# Patient Record
Sex: Female | Born: 1995 | Marital: Single | Smoking: Never smoker
Health system: Southern US, Community
[De-identification: ages and names within clinical notes are randomized; demographics above are authoritative.]

---
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
## 2020-02-11 ENCOUNTER — Emergency Department (HOSPITAL_COMMUNITY): Payer: BC Managed Care – PPO

## 2020-02-11 ENCOUNTER — Encounter (HOSPITAL_COMMUNITY): Payer: Self-pay | Admitting: Emergency Medicine

## 2020-02-11 ENCOUNTER — Other Ambulatory Visit: Payer: Self-pay

## 2020-02-11 ENCOUNTER — Emergency Department (HOSPITAL_COMMUNITY)
Admission: EM | Admit: 2020-02-11 | Discharge: 2020-02-11 | Disposition: A | Discharge status: Home / Self Care | Payer: BC Managed Care – PPO | Attending: Emergency Medicine | Admitting: Emergency Medicine

## 2020-02-11 DIAGNOSIS — M25572 Pain in left ankle and joints of left foot: Secondary | ICD-10-CM | POA: Diagnosis present

## 2020-02-11 DIAGNOSIS — R2242 Localized swelling, mass and lump, left lower limb: Secondary | ICD-10-CM | POA: Insufficient documentation

## 2020-02-11 MED ORDER — NAPROXEN 500 MG PO TABS: 500.0000 mg | ORAL_TABLET | Freq: Two times a day (BID) | ORAL | 0 refills | Status: AC

## 2020-02-11 NOTE — Discharge Instructions (Signed)
You were seen in the ER for a left ankle sprain.  X-ray shows no fracture.  Use ankle brace and crutches, continue activity weightbearing as tolerated, apply ice.  We will send you home with naproxen which is an anti-inflammatory to help with the pain.  Please follow-up with Melony Overly with [facility] orthopedics as soon as possible.
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
## 2021-03-27 ENCOUNTER — Encounter (HOSPITAL_COMMUNITY): Payer: Self-pay | Admitting: Obstetrics and Gynecology

## 2021-03-27 ENCOUNTER — Emergency Department (HOSPITAL_COMMUNITY)
Admission: EM | Admit: 2021-03-27 | Discharge: 2021-03-28 | Disposition: A | Discharge status: Home / Self Care | Payer: Self-pay | Attending: Emergency Medicine | Admitting: Emergency Medicine

## 2021-03-27 ENCOUNTER — Other Ambulatory Visit: Payer: Self-pay

## 2021-03-27 DIAGNOSIS — N760 Acute vaginitis: Secondary | ICD-10-CM | POA: Insufficient documentation

## 2021-03-27 DIAGNOSIS — D259 Leiomyoma of uterus, unspecified: Secondary | ICD-10-CM | POA: Insufficient documentation

## 2021-03-27 DIAGNOSIS — D219 Benign neoplasm of connective and other soft tissue, unspecified: Secondary | ICD-10-CM

## 2021-03-27 DIAGNOSIS — N83202 Unspecified ovarian cyst, left side: Secondary | ICD-10-CM | POA: Insufficient documentation

## 2021-03-27 DIAGNOSIS — B9689 Other specified bacterial agents as the cause of diseases classified elsewhere: Secondary | ICD-10-CM | POA: Insufficient documentation

## 2021-03-27 DIAGNOSIS — N83209 Unspecified ovarian cyst, unspecified side: Secondary | ICD-10-CM

## 2021-03-27 NOTE — ED Provider Notes (Signed)
[facility] DEPT Provider Note   CSN: [id number] Arrival date & time: 03/27/21  2121     History Chief Complaint  Patient presents with  . Abdominal Pain    Kelly Livingston is a 25 y.o. female.  HPI   Pt is a 25 y/o female who presents to the ED today for eval of abd pain. States that pain located to the right lower abdomen and has been waxing and waning for months. States pain is worse when she laughs or coughs too hard. Currently pain is 3/10.  Reports associated nausea, but denies vomiting, diarrhea, constipation, fevers, dysuria, frequency, urgency. Reports that she is currently on her menstrual cycle. Denies any abnormal vaginal discharge. She has been sexually active with 1 partner. She does not use protection every time. [facility] at that time which was negative.   History reviewed. No pertinent past medical history.  Patient Active Problem List   Diagnosis Date Noted  . HYPERCHOLESTEROLEMIA, MILD 08/02/2009  . DYSTHYMIA, SITUATIONAL 07/20/2009  . OVERWEIGHT 10/02/2006    History reviewed. No pertinent surgical history.   OB History    Gravida  0   Para  0   Term  0   Preterm  0   AB  0   Living  0     SAB  0   IAB  0   Ectopic  0   Multiple  0   Live Births  0           No family history on file.  Social History   Tobacco Use  . Smoking status: Never Smoker  . Smokeless tobacco: Never Used  Vaping Use  . Vaping Use: Never used  Substance Use Topics  . Alcohol use: Yes    Comment: Social  . Drug use: Yes    Types: Marijuana    Home Medications Prior to Admission medications   Medication Sig Start Date End Date Taking? Authorizing Provider  ibuprofen (ADVIL) 600 MG tablet Take 1 tablet (600 mg total) by mouth every 6 (six) hours as needed. 03/28/21  Yes Nyiah Pianka S, PA-C  metroNIDAZOLE (FLAGYL) 500 MG tablet Take 1 tablet (500 mg total) by mouth 2  (two) times daily for 7 days. 03/28/21 04/04/21 Yes Rawan Riendeau S, PA-C  naproxen (NAPROSYN) 500 MG tablet Take 1 tablet (500 mg total) by mouth 2 (two) times daily. 02/11/20   Garald Balding, PA-C    Allergies    Patient has no known allergies.  Review of Systems   Review of Systems  Constitutional: Negative for chills and fever.  HENT: Negative for ear pain and sore throat.   Eyes: Negative for visual disturbance.  Respiratory: Negative for cough and shortness of breath.   Cardiovascular: Negative for chest pain.  Gastrointestinal: Positive for nausea. Negative for abdominal pain, constipation, diarrhea and vomiting.  Genitourinary: Negative for dysuria and hematuria.  Musculoskeletal: Negative for back pain.  Skin: Negative for rash.  Neurological: Negative for seizures and syncope.  All other systems reviewed and are negative.   Physical Exam Updated Vital Signs BP 130/87 (BP Location: Right Arm)   Pulse 60   Temp 99.4 F (37.4 C) (Oral)   Resp 18   LMP 03/05/2021   SpO2 100%   Physical Exam Vitals and nursing note reviewed.  Constitutional:      General: She is not in acute distress.    Appearance: She is  well-developed.  HENT:     Head: Normocephalic and atraumatic.  Eyes:     Conjunctiva/sclera: Conjunctivae normal.  Cardiovascular:     Rate and Rhythm: Normal rate and regular rhythm.     Heart sounds: Normal heart sounds. No murmur heard.   Pulmonary:     Effort: Pulmonary effort is normal. No respiratory distress.     Breath sounds: Normal breath sounds. No wheezing, rhonchi or rales.  Abdominal:     General: Bowel sounds are normal.     Palpations: Abdomen is soft.     Tenderness: There is abdominal tenderness in the right lower quadrant and left lower quadrant. There is no guarding or rebound.  Genitourinary:    Comments: Exam performed by Rodney Booze,  exam chaperoned Date: 03/28/2021 Pelvic exam: normal external genitalia without evidence of  trauma. VULVA: normal appearing vulva with no masses, tenderness or lesion. VAGINA: normal appearing vagina with normal color and discharge, no lesions. CERVIX: normal appearing cervix without lesions, cervical motion tenderness absent, cervical os closed with out purulent discharge; scant bleeding from the cervix. Wet prep and DNA probe for chlamydia and GC obtained.   ADNEXA: normal adnexa in size, nontender and no masses UTERUS: uterus is normal size, shape, consistency and nontender.   Musculoskeletal:     Cervical back: Neck supple.  Skin:    General: Skin is warm and dry.  Neurological:     Mental Status: She is alert.     ED Results / Procedures / Treatments   Labs (all labs ordered are listed, but only abnormal results are displayed) Labs Reviewed  WET PREP, GENITAL - Abnormal; Notable for the following components:      Result Value   Clue Cells Wet Prep HPF POC PRESENT (*)    WBC, Wet Prep HPF POC MODERATE (*)    All other components within normal limits  COMPREHENSIVE METABOLIC PANEL - Abnormal; Notable for the following components:   Total Bilirubin 0.2 (*)    All other components within normal limits  CBC - Abnormal; Notable for the following components:   Platelets 148 (*)    All other components within normal limits  URINALYSIS, ROUTINE W REFLEX MICROSCOPIC - Abnormal; Notable for the following components:   Ketones, ur 5 (*)    All other components within normal limits  LIPASE, BLOOD  I-STAT BETA HCG BLOOD, ED ([facility], [facility], [facility] ONLY)  GC/CHLAMYDIA PROBE AMP ([place]) NOT AT [facility]    EKG None  Radiology US Transvaginal Non-OB  Result Date: 03/28/2021 CLINICAL DATA:  Initial evaluation for right adnexal abnormality. EXAM: TRANSABDOMINAL AND TRANSVAGINAL ULTRASOUND OF PELVIS DOPPLER ULTRASOUND OF OVARIES TECHNIQUE: Both transabdominal and transvaginal ultrasound examinations of the pelvis were performed. Transabdominal technique was performed for global imaging of  the pelvis including uterus, ovaries, adnexal regions, and pelvic cul-de-sac. It was necessary to proceed with endovaginal exam following the transabdominal exam to visualize the uterus, endometrium, and ovaries. Color and duplex Doppler ultrasound was utilized to evaluate blood flow to the ovaries. COMPARISON:  Prior CT from earlier the same day. FINDINGS: Uterus Measurements: 8.6 x 3.5 x 4.4 cm = volume: 69.5 mL. Uterus is anteverted. 6 x 5 x 6 mm hypoechoic intramural fibroid present at the left anterior uterine body. Endometrium Thickness: 5.2 mm.  No focal abnormality visualized. Right ovary Measurements: 4.3 x 1.4 x 2.2 cm = volume: 7.2 mL. No adnexal mass seen to correspond with the noted abnormality on prior CT. The right ovary  itself is mildly elongated, suspected to of accounting for appearance on prior study. Left ovary Measurements: 4.9 x 2.6 x 3.8 cm = volume: 26.2 mL. 3.0 x 2.3 x 2.8 cm complex cyst seen within the left ovary. Lesion demonstrates scattered low-level internal echoes with some eccentric mural reticular densities. Few scattered irregular mural based echogenic nodular foci. No appreciable internal vascularity. Pulsed Doppler evaluation of both ovaries demonstrates normal low-resistance arterial and venous waveforms. Other findings No abnormal free fluid. IMPRESSION: 1. 3.0 cm complex left ovarian cyst, indeterminate, but favored to reflect a hemorrhagic cyst. While this is likely benign, given the presence of a few scattered internal nodular echogenic foci, a short interval follow-up ultrasound in 6-12 weeks is recommended to ensure resolution. 2. No evidence for ovarian torsion or other acute abnormality. 3. No right adnexal mass seen to correspond with abnormality on prior CT. The right ovary is somewhat elongated in morphology, suspected to account for the appearance on prior CT. 4. 6 mm intramural fibroid at the left anterior uterine body. Electronically Signed   By: Jeannine Boga M.D.   On: 03/28/2021 03:29   US Pelvis Complete  Result Date: 03/28/2021 CLINICAL DATA:  Initial evaluation for right adnexal abnormality. EXAM: TRANSABDOMINAL AND TRANSVAGINAL ULTRASOUND OF PELVIS DOPPLER ULTRASOUND OF OVARIES TECHNIQUE: Both transabdominal and transvaginal ultrasound examinations of the pelvis were performed. Transabdominal technique was performed for global imaging of the pelvis including uterus, ovaries, adnexal regions, and pelvic cul-de-sac. It was necessary to proceed with endovaginal exam following the transabdominal exam to visualize the uterus, endometrium, and ovaries. Color and duplex Doppler ultrasound was utilized to evaluate blood flow to the ovaries. COMPARISON:  Prior CT from earlier the same day. FINDINGS: Uterus Measurements: 8.6 x 3.5 x 4.4 cm = volume: 69.5 mL. Uterus is anteverted. 6 x 5 x 6 mm hypoechoic intramural fibroid present at the left anterior uterine body. Endometrium Thickness: 5.2 mm.  No focal abnormality visualized. Right ovary Measurements: 4.3 x 1.4 x 2.2 cm = volume: 7.2 mL. No adnexal mass seen to correspond with the noted abnormality on prior CT. The right ovary itself is mildly elongated, suspected to of accounting for appearance on prior study. Left ovary Measurements: 4.9 x 2.6 x 3.8 cm = volume: 26.2 mL. 3.0 x 2.3 x 2.8 cm complex cyst seen within the left ovary. Lesion demonstrates scattered low-level internal echoes with some eccentric mural reticular densities. Few scattered irregular mural based echogenic nodular foci. No appreciable internal vascularity. Pulsed Doppler evaluation of both ovaries demonstrates normal low-resistance arterial and venous waveforms. Other findings No abnormal free fluid. IMPRESSION: 1. 3.0 cm complex left ovarian cyst, indeterminate, but favored to reflect a hemorrhagic cyst. While this is likely benign, given the presence of a few scattered internal nodular echogenic foci, a short interval follow-up  ultrasound in 6-12 weeks is recommended to ensure resolution. 2. No evidence for ovarian torsion or other acute abnormality. 3. No right adnexal mass seen to correspond with abnormality on prior CT. The right ovary is somewhat elongated in morphology, suspected to account for the appearance on prior CT. 4. 6 mm intramural fibroid at the left anterior uterine body. Electronically Signed   By: Jeannine Boga M.D.   On: 03/28/2021 03:29   CT ABDOMEN PELVIS W CONTRAST  Result Date: 03/28/2021 CLINICAL DATA:  Recurrent right abdominal pain for greater than 1 month, worse with eating, nausea EXAM: CT ABDOMEN AND PELVIS WITH CONTRAST TECHNIQUE: Multidetector CT imaging of the abdomen and pelvis  was performed using the standard protocol following bolus administration of intravenous contrast. CONTRAST:  117mL OMNIPAQUE IOHEXOL 300 MG/ML  SOLN COMPARISON:  None. FINDINGS: Lower chest: Lung bases are clear. Normal heart size. No pericardial effusion. Hepatobiliary: No worrisome focal liver lesions. Smooth liver surface contour. Normal hepatic attenuation. Normal gallbladder and biliary tree. No visible calcified gallstones. Pancreas: No pancreatic ductal dilatation or surrounding inflammatory changes. Spleen: Normal in size. No concerning splenic lesions. Adrenals/Urinary Tract: Normal adrenals. Kidneys are normally located with symmetric enhancement. No suspicious renal lesion, urolithiasis or hydronephrosis. Urinary bladder is unremarkable for the degree of distention. Stomach/Bowel: Distal esophagus, stomach and duodenum are unremarkable. No small bowel thickening or dilatation. Mobile appearance of the cecum displaced slightly towards the right upper quadrant. No resulting obstruction or frank volvulus is seen. Diminutive noninflamed appendix extending laterally from the cecal tip. No colonic dilatation or wall thickening. Vascular/Lymphatic: Several clustered lymph nodes are seen in the right mid abdomen near the  displaced cecum measuring between 5 the mm which is possibly reactive though not pathologically enlarged. No other suspicious adenopathy in the abdomen or pelvis. No significant vascular findings. Reproductive: Anteverted uterus. Slightly elongated, intermediate attenuation structure in the right adnexa, could reflect a complex hydrosalpinx versus possible pyosalpinx (2/71). More bland appearing cyst/follicle in the left adnexa measuring 2.9 cm. Radiolucent tampon within the vaginal canal. Other: Small volume low-attenuation fluid in the deep pelvis, nonspecific in a reproductive age female. Musculoskeletal: Rudimentary ribs T12. No acute osseous abnormality or suspicious osseous lesion. IMPRESSION: 1. Slightly mobile cecum displaced to the right upper quadrant. No resulting obstruction or volvulus. 2. Noninflamed diminutive appendix is visualized. 3. Few clustered lymph nodes are prominent though non pathologically enlarged. Nonspecific finding though could be seen as a reactive process or with mesenteric adenitis though this is a diagnosis of exclusion. 4. Intermediate attenuation tubular structure in the right adnexa, could reflect hydrosalpinx or possible pyosalpinx. Correlate with clinical symptoms and consider further interrogation with pelvic ultrasound. 5. More normal appearing follicle in the left ovary measuring 2.9 cm. No follow-up imaging recommended. Note: This recommendation does not apply to premenarchal patients and to those with increased risk (genetic, family history, elevated tumor markers or other high-risk factors) of ovarian cancer. Reference: [facility] 2020 Feb; 17(2):248-254 Electronically Signed   By: Lovena Le M.D.   On: 03/28/2021 02:09   [place] Art/Ven Flow Abd Pelv Doppler  Result Date: 03/28/2021 CLINICAL DATA:  Initial evaluation for right adnexal abnormality. EXAM: TRANSABDOMINAL AND TRANSVAGINAL ULTRASOUND OF PELVIS DOPPLER ULTRASOUND OF OVARIES TECHNIQUE: Both transabdominal and  transvaginal ultrasound examinations of the pelvis were performed. Transabdominal technique was performed for global imaging of the pelvis including uterus, ovaries, adnexal regions, and pelvic cul-de-sac. It was necessary to proceed with endovaginal exam following the transabdominal exam to visualize the uterus, endometrium, and ovaries. Color and duplex Doppler ultrasound was utilized to evaluate blood flow to the ovaries. COMPARISON:  Prior CT from earlier the same day. FINDINGS: Uterus Measurements: 8.6 x 3.5 x 4.4 cm = volume: 69.5 mL. Uterus is anteverted. 6 x 5 x 6 mm hypoechoic intramural fibroid present at the left anterior uterine body. Endometrium Thickness: 5.2 mm.  No focal abnormality visualized. Right ovary Measurements: 4.3 x 1.4 x 2.2 cm = volume: 7.2 mL. No adnexal mass seen to correspond with the noted abnormality on prior CT. The right ovary itself is mildly elongated, suspected to of accounting for appearance on prior study. Left ovary Measurements: 4.9 x 2.6 x 3.8 cm =  volume: 26.2 mL. 3.0 x 2.3 x 2.8 cm complex cyst seen within the left ovary. Lesion demonstrates scattered low-level internal echoes with some eccentric mural reticular densities. Few scattered irregular mural based echogenic nodular foci. No appreciable internal vascularity. Pulsed Doppler evaluation of both ovaries demonstrates normal low-resistance arterial and venous waveforms. Other findings No abnormal free fluid. IMPRESSION: 1. 3.0 cm complex left ovarian cyst, indeterminate, but favored to reflect a hemorrhagic cyst. While this is likely benign, given the presence of a few scattered internal nodular echogenic foci, a short interval follow-up ultrasound in 6-12 weeks is recommended to ensure resolution. 2. No evidence for ovarian torsion or other acute abnormality. 3. No right adnexal mass seen to correspond with abnormality on prior CT. The right ovary is somewhat elongated in morphology, suspected to account for the  appearance on prior CT. 4. 6 mm intramural fibroid at the left anterior uterine body. Electronically Signed   By: Jeannine Boga M.D.   On: 03/28/2021 03:29    Procedures Procedures   Medications Ordered in ED Medications  iohexol (OMNIPAQUE) 300 MG/ML solution 100 mL (100 mLs Intravenous Contrast Given 03/28/21 0120)    ED Course  I have reviewed the triage vital signs and the nursing notes.  Pertinent labs & imaging results that were available during my care of the patient were reviewed by me and considered in my medical decision making (see chart for details).    MDM Rules/Calculators/A&P                          25 y/o f presenting for abd pain  Reviewed/interpreted labs CBC unremarkable CMP unremarkable  Lipase unremarkable Preg test neg UA neg for uti  Reviewed/interpreted imaging CT abd/pelvis -  1. Slightly mobile cecum displaced to the right upper quadrant. No resulting obstruction or volvulus. 2. Noninflamed diminutive appendix is visualized. 3. Few clustered lymph nodes are prominent though non pathologically enlarged. Nonspecific finding though could be seen as a reactive process or with mesenteric adenitis though this is a diagnosis of exclusion. 4. Intermediate attenuation tubular structure in the right adnexa, could reflect hydrosalpinx or possible pyosalpinx. Correlate with clinical symptoms and consider further interrogation with pelvic ultrasound. 5. More normal appearing follicle in the left ovary measuring 2.9 cm. Pelvic US -  1. 3.0 cm complex left ovarian cyst, indeterminate, but favored to reflect a hemorrhagic cyst. While this is likely benign, given the presence of a few scattered internal nodular echogenic foci, a short interval follow-up ultrasound in 6-12 weeks is recommended to ensure resolution. 2. No evidence for ovarian torsion or other acute abnormality. 3. No right adnexal mass seen to correspond with abnormality on prior CT. The right ovary is  somewhat elongated in morphology, suspected to account for the appearance on prior CT. 4. 6 mm intramural fibroid at the left anterior uterine body  W/u showed fibroids, ovarian cyst. This is likely the cause of pts sxs. Have advised ibuprofen, rx given. Also given flagyl for bv. Exam not suggestive of pid. Will have her f/u with her obgyn. Advised on strict return precautions. She voices understanding of the plan and reasons to return. All questions answered, pt stable for discharge.  Final Clinical Impression(s) / ED Diagnoses Final diagnoses:  Fibroid  Cyst of ovary, unspecified laterality  Bacterial vaginosis    Rx / DC Orders ED Discharge Orders         Ordered    metroNIDAZOLE (FLAGYL) 500 MG  tablet  2 times daily        03/28/21 0405    ibuprofen (ADVIL) 600 MG tablet  Every 6 hours PRN        03/28/21 0405           Lilah Mijangos S, PA-C 03/28/21 0602    Varney Biles, MD 03/29/21 1644

## 2021-03-27 NOTE — ED Triage Notes (Signed)
Emergency Medicine Provider Triage Evaluation Note  Kelly Livingston , a 25 y.o. female  was evaluated in triage.  Pt complains of abd pain.  Review of Systems  Positive: abd pain, nausea Negative: Fever, cp, sob, dysuria  Physical Exam  BP (!) 151/97 (BP Location: Left Arm)   Pulse 70   Temp 99.6 F (37.6 C) (Oral)   Resp 17   LMP 03/05/2021   SpO2 100%  Gen:   Awake, no distress   HEENT:  Atraumatic  Resp:  Normal effort  Cardiac:  Normal rate  Abd:   Nondistended, nontender  MSK:   Moves extremities without difficulty  Neuro:  Speech clear   Medical Decision Making  Medically screening exam initiated at 10:04 PM.  Appropriate orders placed.  Dene Landsberg was informed that the remainder of the evaluation will be completed by another provider, this initial triage assessment does not replace that evaluation, and the importance of remaining in the ED until their evaluation is complete.  Clinical Impression  Recurrent R side abdominal pain >1 month, worse with eating, nausea. Request pregnancy test as well.    Domenic Moras, PA-C 03/27/21 2212

## 2021-03-27 NOTE — ED Triage Notes (Signed)
[facility] Patient reports they took a pregnancy test and it was negative.

## 2021-03-28 ENCOUNTER — Emergency Department (HOSPITAL_COMMUNITY): Payer: Self-pay

## 2021-03-28 ENCOUNTER — Encounter (HOSPITAL_COMMUNITY): Payer: Self-pay

## 2021-03-28 LAB — COMPREHENSIVE METABOLIC PANEL
ALT: 28 U/L (ref 0–44)
AST: 22 U/L (ref 15–41)
Albumin: 4.3 g/dL (ref 3.5–5.0)
Alkaline Phosphatase: 64 U/L (ref 38–126)
Anion gap: 11 (ref 5–15)
BUN: 9 mg/dL (ref 6–20)
CO2: 25 mmol/L (ref 22–32)
Calcium: 9.8 mg/dL (ref 8.9–10.3)
Chloride: 106 mmol/L (ref 98–111)
Creatinine, Ser: 0.82 mg/dL (ref 0.44–1.00)
GFR, Estimated: >60 mL/min (ref >60)
Glucose, Bld: 83 mg/dL (ref 70–99)
Potassium: 3.9 mmol/L (ref 3.5–5.1)
Sodium: 142 mmol/L (ref 135–145)
Total Bilirubin: 0.2 mg/dL — ABNORMAL LOW (ref 0.3–1.2)
Total Protein: 7.6 g/dL (ref 6.5–8.1)

## 2021-03-28 LAB — URINALYSIS, ROUTINE W REFLEX MICROSCOPIC
Bilirubin Urine: NEGATIVE
Glucose, UA: NEGATIVE mg/dL
Hgb urine dipstick: NEGATIVE
Ketones, ur: 5 mg/dL — AB
Leukocytes,Ua: NEGATIVE
Nitrite: NEGATIVE
Protein, ur: NEGATIVE mg/dL
Specific Gravity, Urine: 1.01 (ref 1.005–1.030)
pH: 5 (ref 5.0–8.0)

## 2021-03-28 LAB — CBC
HCT: 37.3 % (ref 36.0–46.0)
Hemoglobin: 12.4 g/dL (ref 12.0–15.0)
MCH: 29.7 pg (ref 26.0–34.0)
MCHC: 33.2 g/dL (ref 30.0–36.0)
MCV: 89.2 fL (ref 80.0–100.0)
Platelets: 148 10*3/uL — ABNORMAL LOW (ref 150–400)
RBC: 4.18 MIL/uL (ref 3.87–5.11)
RDW: 13.7 % (ref 11.5–15.5)
WBC: 4.5 10*3/uL (ref 4.0–10.5)
nRBC: 0 % (ref 0.0–0.2)

## 2021-03-28 LAB — WET PREP, GENITAL
Sperm: NONE SEEN
Trich, Wet Prep: NONE SEEN
Yeast Wet Prep HPF POC: NONE SEEN

## 2021-03-28 LAB — LIPASE, BLOOD: Lipase: 25 U/L (ref 11–51)

## 2021-03-28 LAB — GC/CHLAMYDIA PROBE AMP (~~LOC~~) NOT AT ARMC
Chlamydia: NEGATIVE
Comment: NEGATIVE
Comment: NORMAL
Neisseria Gonorrhea: NEGATIVE

## 2021-03-28 LAB — I-STAT BETA HCG BLOOD, ED (MC, WL, AP ONLY): I-stat hCG, quantitative: <5 m[IU]/mL (ref <5)

## 2021-03-28 MED ORDER — METRONIDAZOLE 500 MG PO TABS: 500.0000 mg | ORAL_TABLET | Freq: Two times a day (BID) | ORAL | 0 refills | Status: AC

## 2021-03-28 MED ORDER — IOHEXOL 300 MG/ML  SOLN
100.0000 mL | Freq: Once | INTRAMUSCULAR | Status: AC | PRN
  Administered 2021-03-28: 100 mL via INTRAVENOUS

## 2021-03-28 MED ORDER — IBUPROFEN 600 MG PO TABS: 600.0000 mg | ORAL_TABLET | Freq: Four times a day (QID) | ORAL | 0 refills | Status: AC | PRN

## 2021-03-28 NOTE — Discharge Instructions (Addendum)
The results of your ultrasound are as follows, "1. 3.0 cm complex left ovarian cyst, indeterminate, but favored to reflect a hemorrhagic cyst. While this is likely benign, given the presence of a few scattered internal nodular echogenic foci, a short interval follow-up ultrasound in 6-12 weeks is recommended to ensure resolution. 2. No evidence for ovarian torsion or other acute abnormality. 3. No right adnexal mass seen to correspond with abnormality on prior CT. The right ovary is somewhat elongated in morphology, suspected to account for the appearance on prior CT. 4. 6 mm intramural fibroid at the left anterior uterine body."  Your wet prep showed bacterial vaginosis.  You are given an antibiotic to help treat this.  Do not drink alcohol while on this antibiotic as it will make you feel very sick.  Additionally you are given some ibuprofen to help with the pain You are having in your lower abdomen.  Please follow-up with your OB/GYN and return to the emergency department for any new or worsening symptoms in the meantime.

## 2022-09-01 IMAGING — US US PELVIS COMPLETE
1 series · 13 of 25 positions shown · non-contrast
Comparison: Prior CT from earlier the same day.

CLINICAL DATA: Initial evaluation for right adnexal abnormality.

EXAM:
TRANSABDOMINAL AND TRANSVAGINAL ULTRASOUND OF PELVIS
DOPPLER ULTRASOUND OF OVARIES
TECHNIQUE: Both transabdominal and transvaginal ultrasound examinations of the
pelvis were performed. Transabdominal technique was performed for
global imaging of the pelvis including uterus, ovaries, adnexal
regions, and pelvic cul-de-sac.
It was necessary to proceed with endovaginal exam following the
transabdominal exam to visualize the uterus, endometrium, and
ovaries. Color and duplex Doppler ultrasound was utilized to
evaluate blood flow to the ovaries.

[Series 1: us pelvis complete · 13 of 91 slices shown]
[im 1/91]
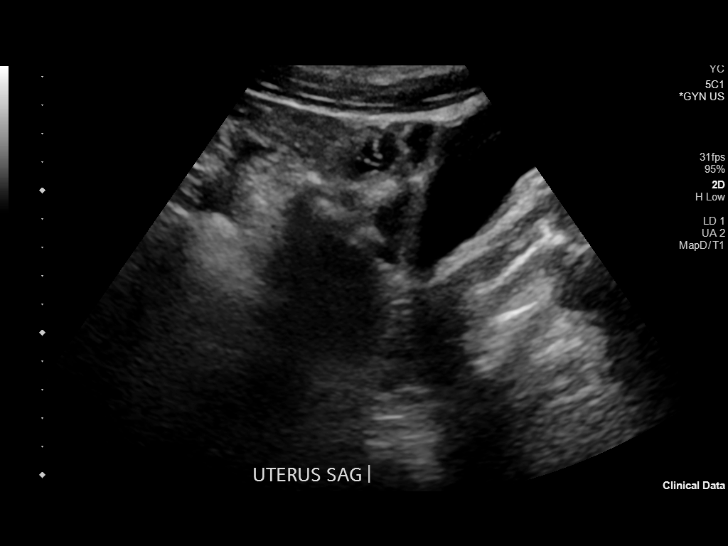
[im 8/91]
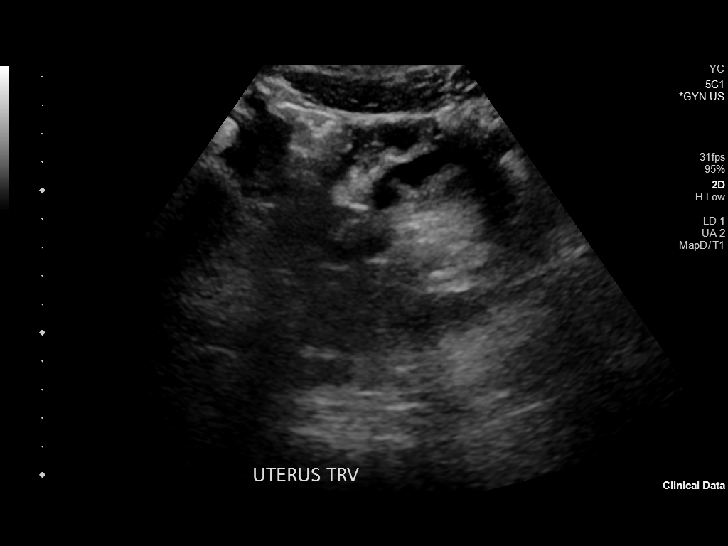
[im 16/91]
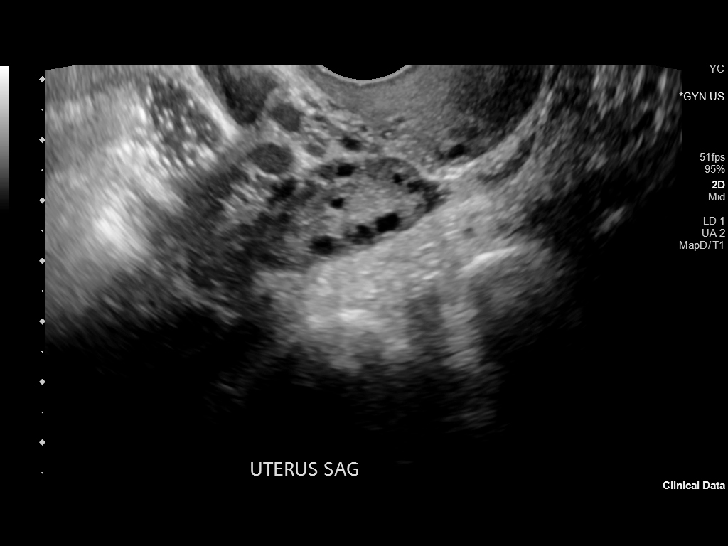
[im 23/91]
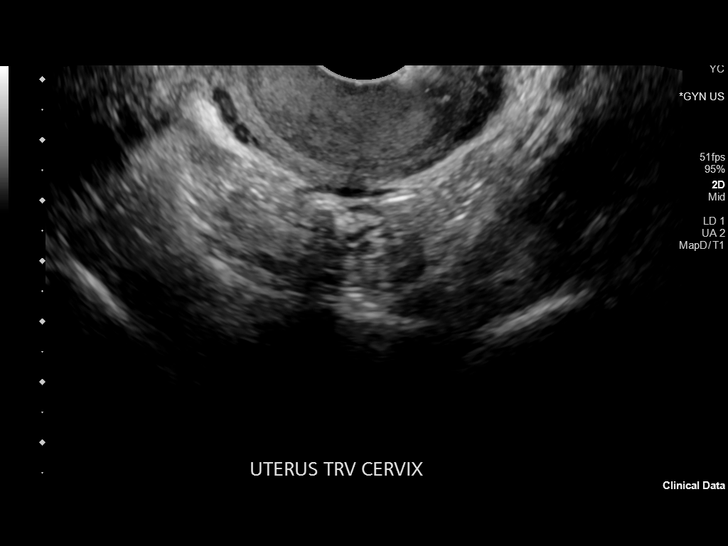
[im 31/91]
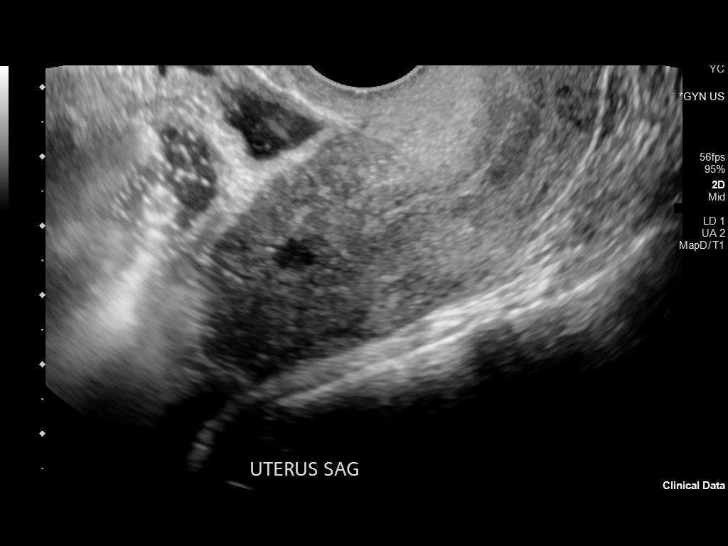
[im 38/91]
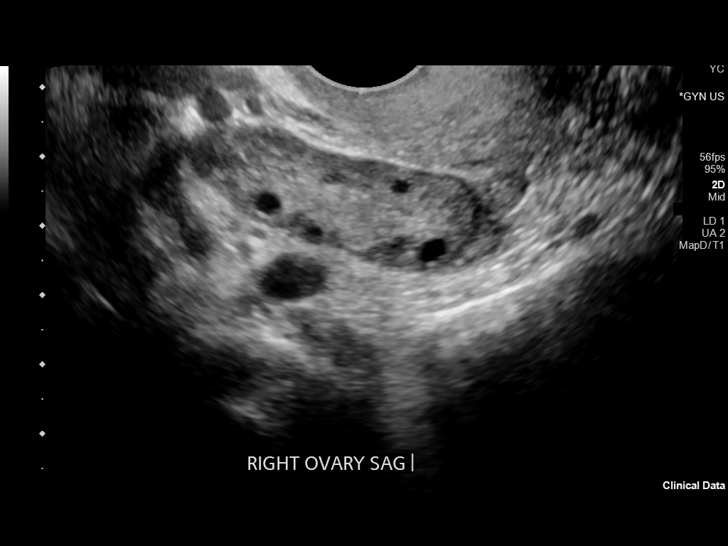
[im 46/91]
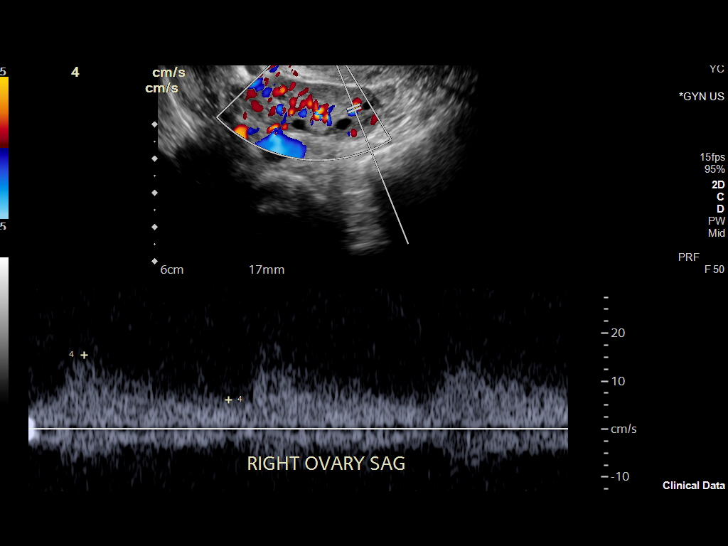
[im 53/91]
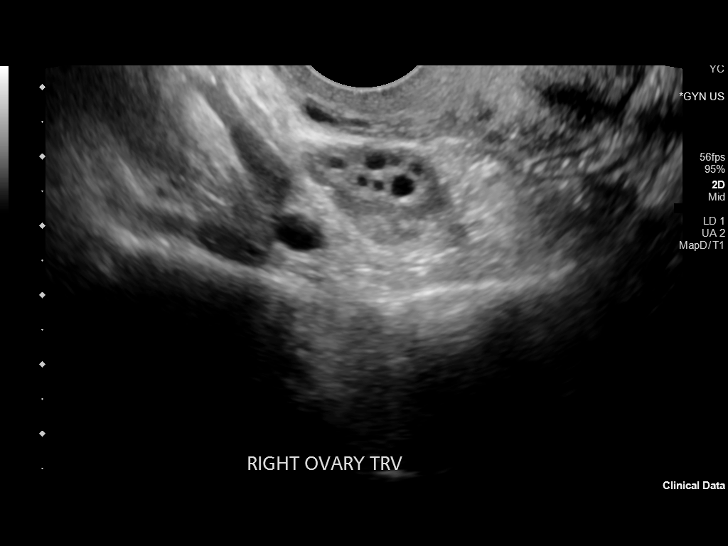
[im 61/91]
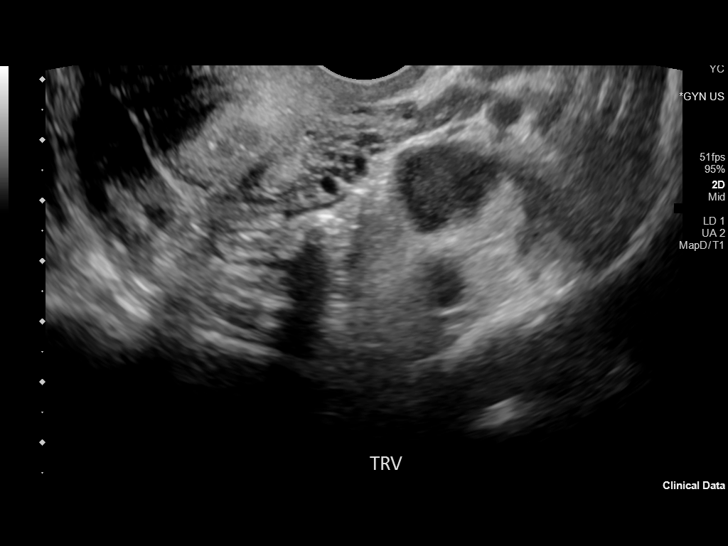
[im 68/91]
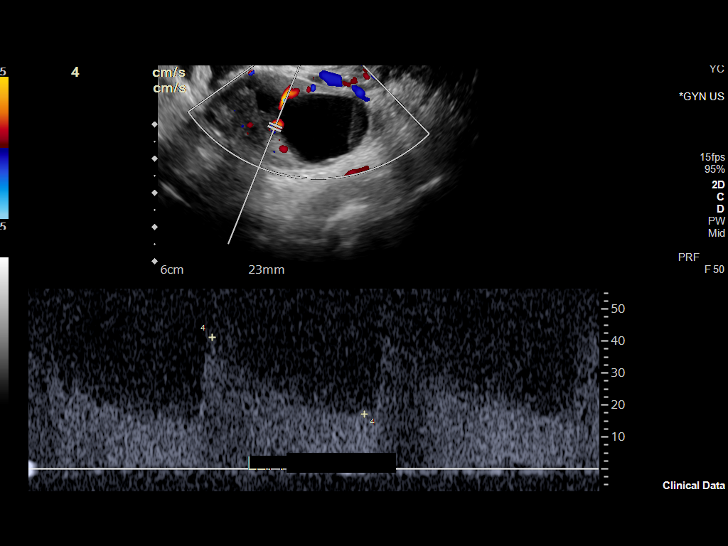
[im 76/91]
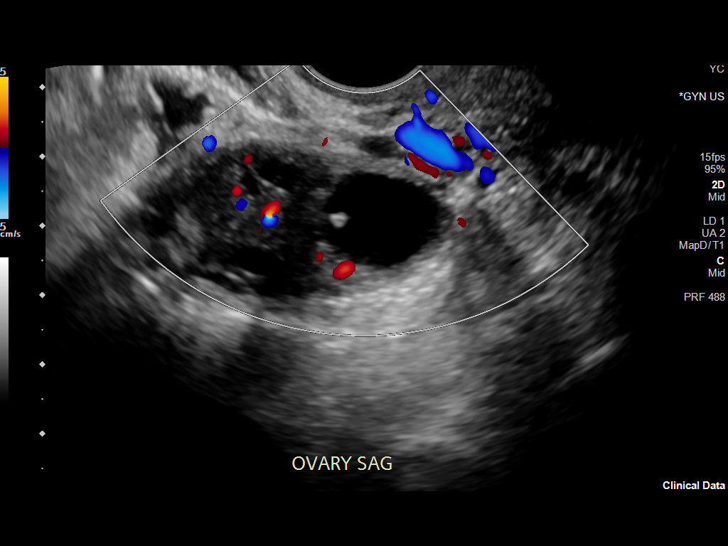
[im 83/91]
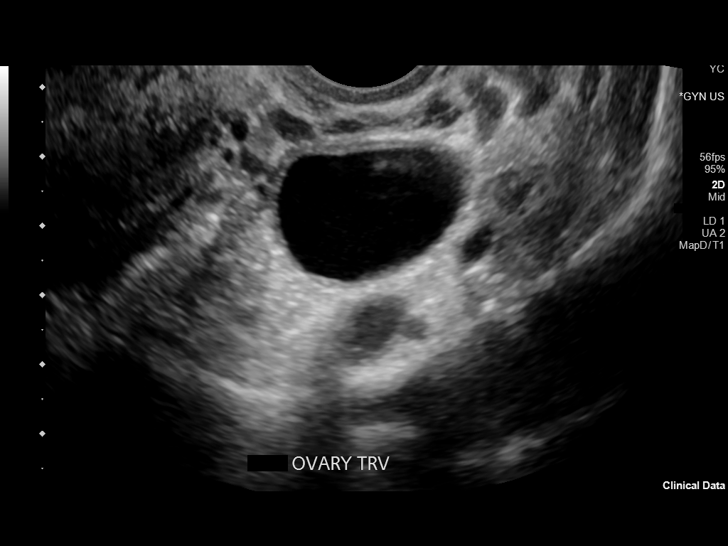
[im 91/91]
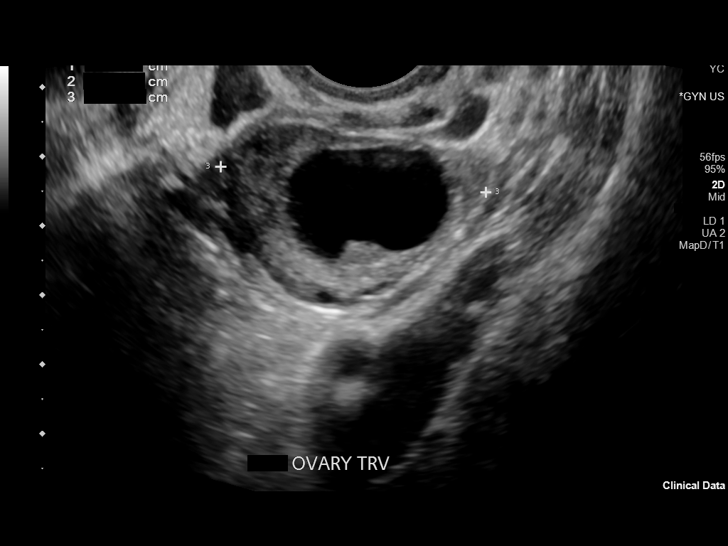

[13 of 25 positions shown; findings below may reference images not displayed]

FINDINGS: Uterus

Measurements: 8.6 x 3.5 x 4.4 cm = volume: 69.5 mL. Uterus is
anteverted. 6 x 5 x 6 mm hypoechoic intramural fibroid present at
the left anterior uterine body.

Endometrium

Thickness: 5.2 mm.  No focal abnormality visualized.

Right ovary

Measurements: 4.3 x 1.4 x 2.2 cm = volume: 7.2 mL. No adnexal mass
seen to correspond with the noted abnormality on prior CT. The right
ovary itself is mildly elongated, suspected to of accounting for
appearance on prior study.

Left ovary

Measurements: 4.9 x 2.6 x 3.8 cm = volume: 26.2 mL. 3.0 x 2.3 x
cm complex cyst seen within the left ovary. Lesion demonstrates
scattered low-level internal echoes with some eccentric mural
reticular densities. Few scattered irregular mural based echogenic
nodular foci. No appreciable internal vascularity.

Pulsed Doppler evaluation of both ovaries demonstrates normal
low-resistance arterial and venous waveforms.

Other findings

No abnormal free fluid.
IMPRESSION: 1. 3.0 cm complex left ovarian cyst, indeterminate, but favored to
reflect a hemorrhagic cyst. While this is likely benign, given the
presence of a few scattered internal nodular echogenic foci, a short
interval follow-up ultrasound in 6-12 weeks is recommended to ensure
resolution.
2. No evidence for ovarian torsion or other acute abnormality.
3. No right adnexal mass seen to correspond with abnormality on
prior CT. The right ovary is somewhat elongated in morphology,
suspected to account for the appearance on prior CT.
4. 6 mm intramural fibroid at the left anterior uterine body.

## 2022-09-01 IMAGING — CT CT ABD-PELV W/ CM
2 of 4 series · 15 of 46 positions shown, 17 images · IV contrast (omnipaque)
Comparison: None.

CLINICAL DATA: Recurrent right abdominal pain for greater than 1
month, worse with eating, nausea

EXAM:
CT ABDOMEN AND PELVIS WITH CONTRAST
TECHNIQUE: Multidetector CT imaging of the abdomen and pelvis was performed
using the standard protocol following bolus administration of
intravenous contrast.
CONTRAST:  100mL OMNIPAQUE IOHEXOL 300 MG/ML  SOLN

[Series 2: axial st · axial · 0.79mm/px · z∈[-498,-68]mm · 12 of 98 slices shown, 14 images]
[im 6/98  soft-tissue]
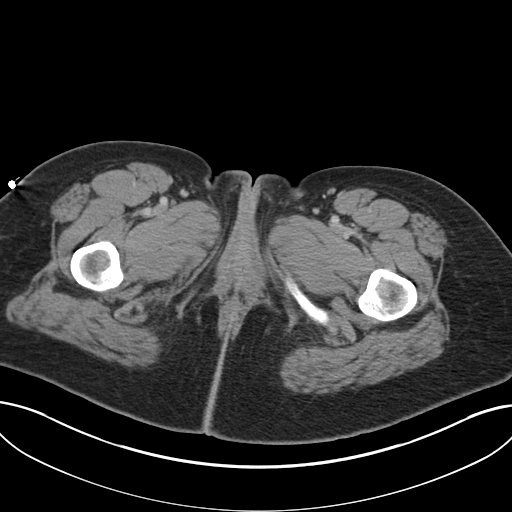
[im 6/98  bone]
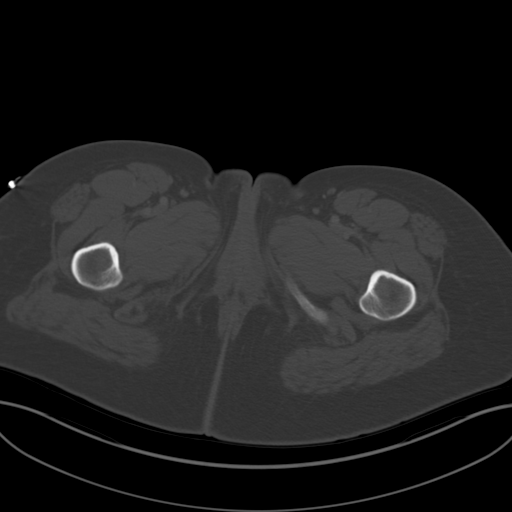
[im 17/98  soft-tissue]
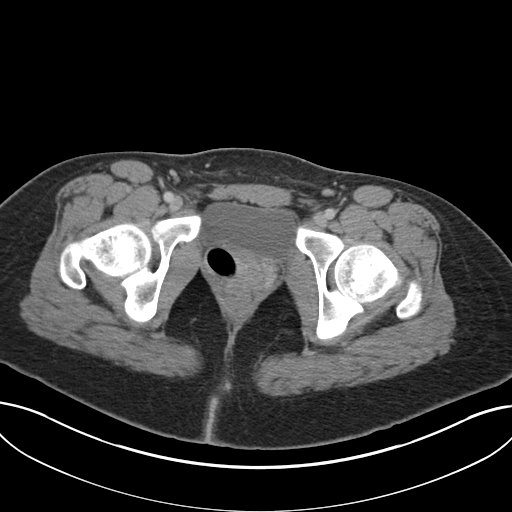
[im 22/98  soft-tissue]
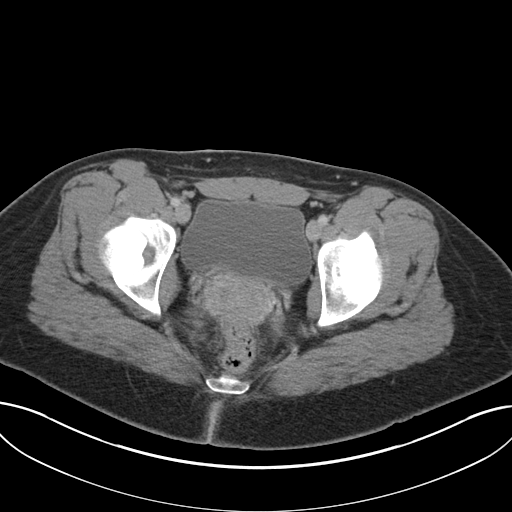
[im 27/98  soft-tissue]
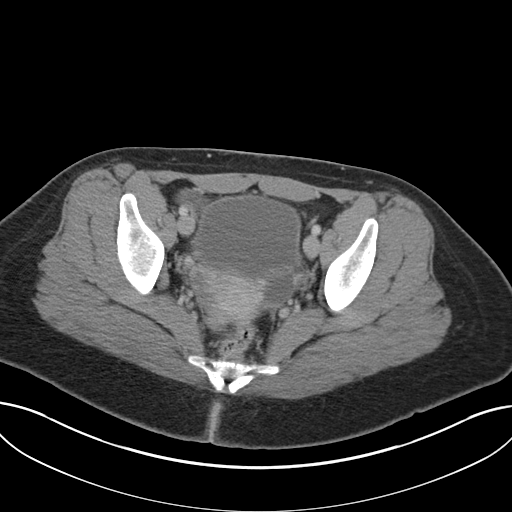
[im 38/98  soft-tissue]
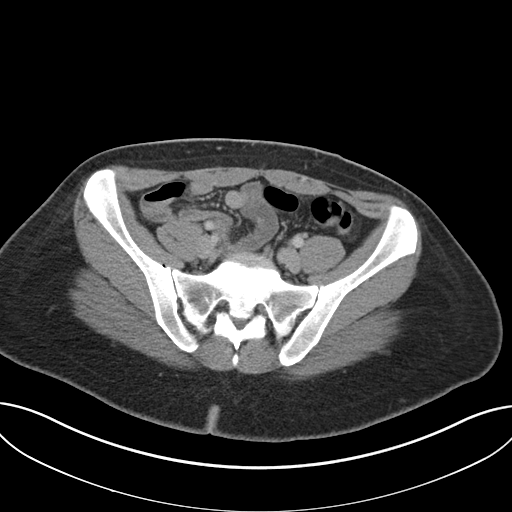
[im 44/98  soft-tissue]
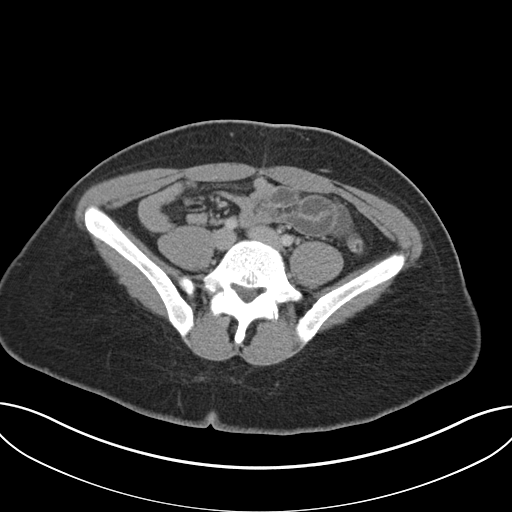
[im 54/98  soft-tissue]
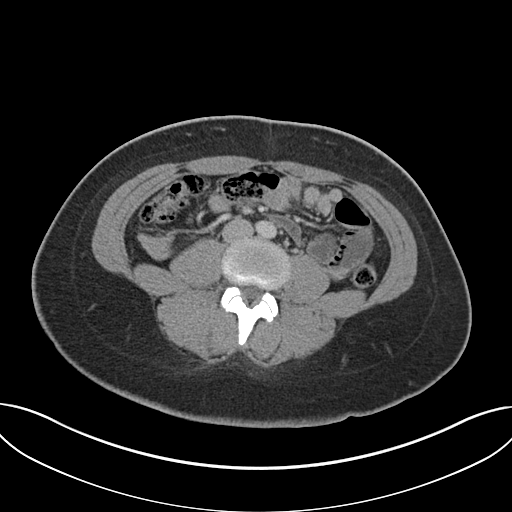
[im 60/98  soft-tissue]
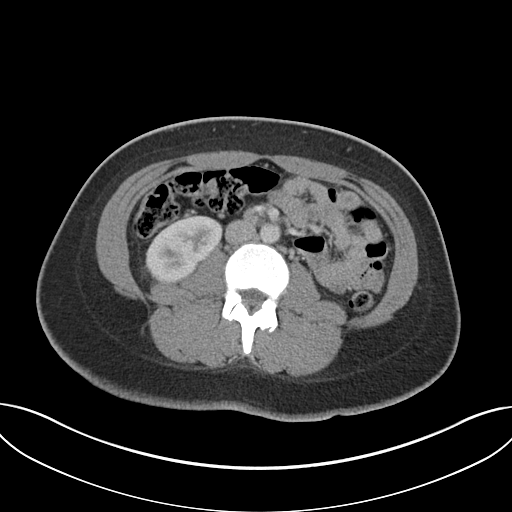
[im 71/98  soft-tissue]
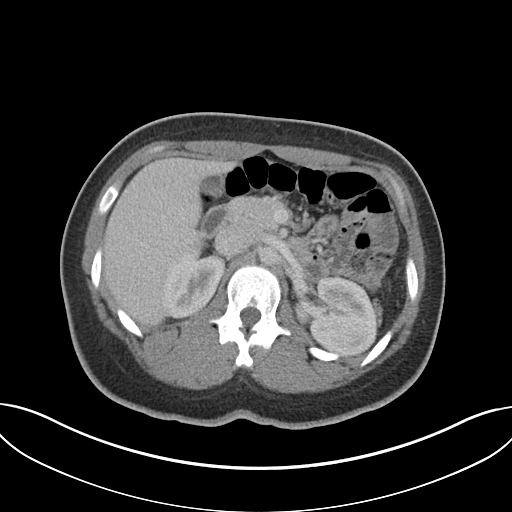
[im 71/98  bone]
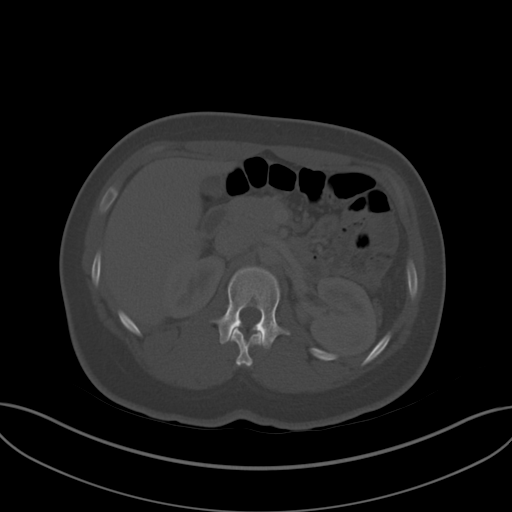
[im 76/98  soft-tissue]
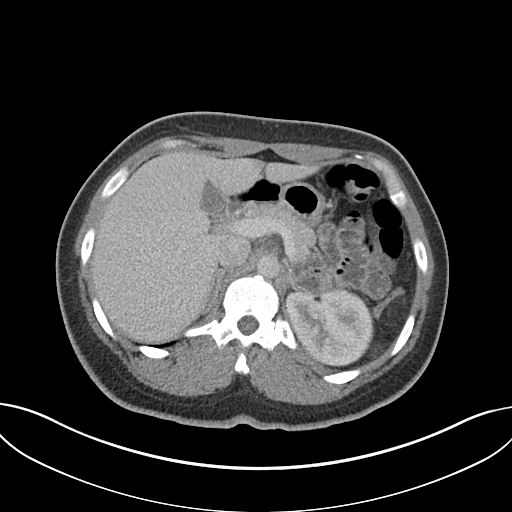
[im 81/98  soft-tissue]
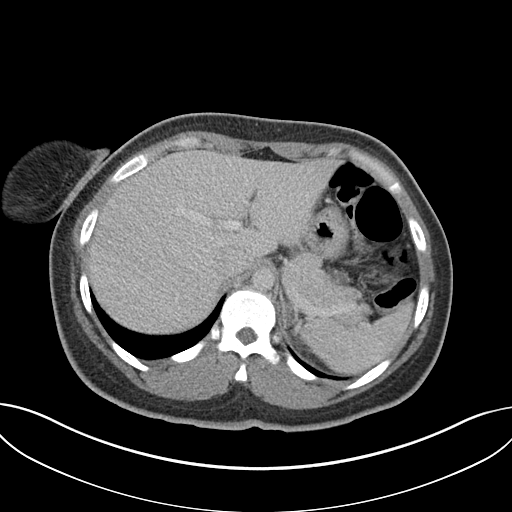
[im 92/98  soft-tissue]
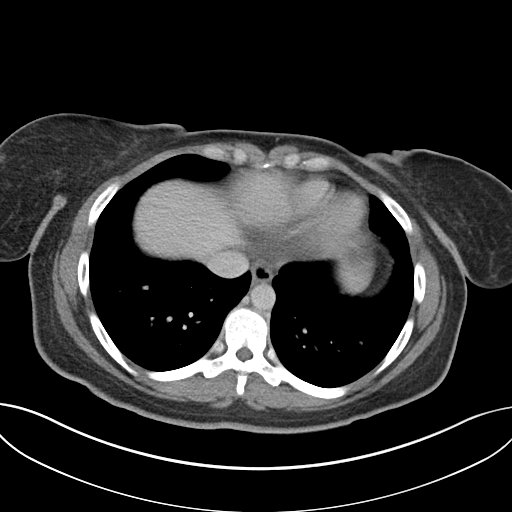

[Series 5: coronal st · coronal · 0.74mm/px · 3 of 147 slices shown]
[im 49/147  soft-tissue]
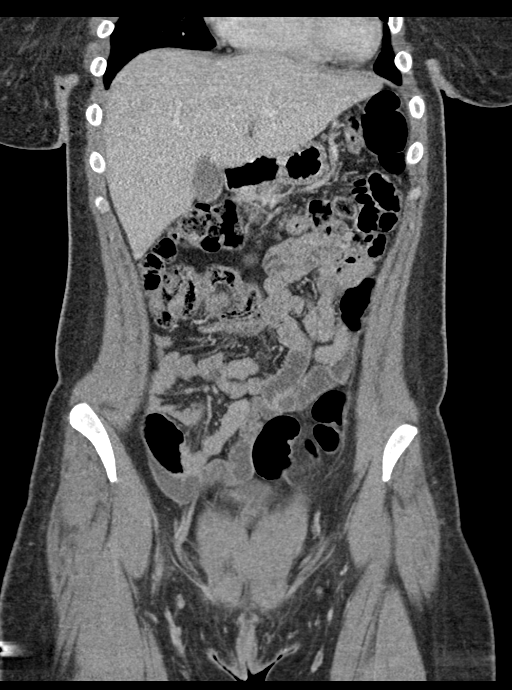
[im 65/147  soft-tissue]
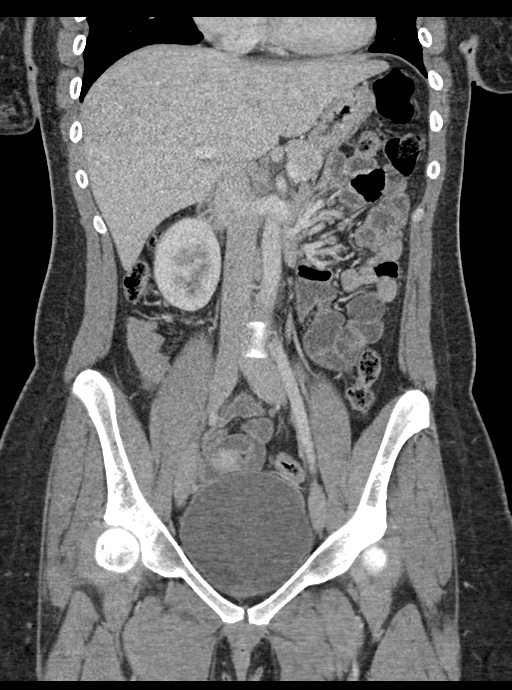
[im 82/147  soft-tissue]
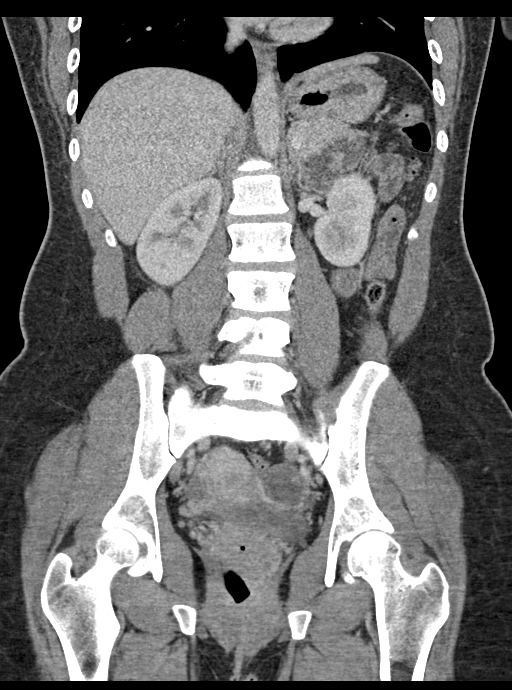

[15 of 46 positions shown; findings below may reference images not displayed]

FINDINGS: Lower chest: Lung bases are clear. Normal heart size. No pericardial
effusion.

Hepatobiliary: No worrisome focal liver lesions. Smooth liver
surface contour. Normal hepatic attenuation. Normal gallbladder and
biliary tree. No visible calcified gallstones.

Pancreas: No pancreatic ductal dilatation or surrounding
inflammatory changes.

Spleen: Normal in size. No concerning splenic lesions.

Adrenals/Urinary Tract: Normal adrenals. Kidneys are normally
located with symmetric enhancement. No suspicious renal lesion,
urolithiasis or hydronephrosis. Urinary bladder is unremarkable for
the degree of distention.

Stomach/Bowel: Distal esophagus, stomach and duodenum are
unremarkable. No small bowel thickening or dilatation. Mobile
appearance of the cecum displaced slightly towards the right upper
quadrant. No resulting obstruction or frank volvulus is seen.
Diminutive noninflamed appendix extending laterally from the cecal
tip. No colonic dilatation or wall thickening.

Vascular/Lymphatic: Several clustered lymph nodes are seen in the
right mid abdomen near the displaced cecum measuring between 5 the
mm which is possibly reactive though not pathologically enlarged. No
other suspicious adenopathy in the abdomen or pelvis. No significant
vascular findings.

Reproductive: Anteverted uterus. Slightly elongated, intermediate
attenuation structure in the right adnexa, could reflect a complex
hydrosalpinx versus possible pyosalpinx (2/71). More bland appearing
cyst/follicle in the left adnexa measuring 2.9 cm. Radiolucent
tampon within the vaginal canal.

Other: Small volume low-attenuation fluid in the deep pelvis,
nonspecific in a reproductive age female.

Musculoskeletal: Rudimentary ribs T12. No acute osseous abnormality
or suspicious osseous lesion.
IMPRESSION: 1. Slightly mobile cecum displaced to the right upper quadrant. No
resulting obstruction or volvulus.
2. Noninflamed diminutive appendix is visualized.
3. Few clustered lymph nodes are prominent though non pathologically
enlarged. Nonspecific finding though could be seen as a reactive
process or with mesenteric adenitis though this is a diagnosis of
exclusion.
4. Intermediate attenuation tubular structure in the right adnexa,
could reflect hydrosalpinx or possible pyosalpinx. Correlate with
clinical symptoms and consider further interrogation with pelvic
ultrasound.
5. More normal appearing follicle in the left ovary measuring
cm. No follow-up imaging recommended. Note: This recommendation does
not apply to premenarchal patients and to those with increased risk
(genetic, family history, elevated tumor markers or other high-risk
factors) of ovarian cancer. Reference: JACR [DATE]):[id number]
